# Patient Record
Sex: Male | Born: 1940 | Race: White | Hispanic: No | Marital: Married
Health system: Southern US, Community
[De-identification: ages and names within clinical notes are randomized; demographics above are authoritative.]

## PROBLEM LIST (undated history)

## (undated) DIAGNOSIS — R1312 Dysphagia, oropharyngeal phase: Secondary | ICD-10-CM

## (undated) DIAGNOSIS — G218 Other secondary parkinsonism: Secondary | ICD-10-CM

## (undated) DIAGNOSIS — G4700 Insomnia, unspecified: Secondary | ICD-10-CM

## (undated) DIAGNOSIS — F0390 Unspecified dementia without behavioral disturbance: Secondary | ICD-10-CM

## (undated) DIAGNOSIS — K219 Gastro-esophageal reflux disease without esophagitis: Secondary | ICD-10-CM

## (undated) DIAGNOSIS — E785 Hyperlipidemia, unspecified: Secondary | ICD-10-CM

## (undated) DIAGNOSIS — J449 Chronic obstructive pulmonary disease, unspecified: Secondary | ICD-10-CM

## (undated) DIAGNOSIS — I2510 Atherosclerotic heart disease of native coronary artery without angina pectoris: Secondary | ICD-10-CM

## (undated) DIAGNOSIS — I4891 Unspecified atrial fibrillation: Secondary | ICD-10-CM

## (undated) DIAGNOSIS — G20 Parkinson's disease: Secondary | ICD-10-CM

---

## 2013-12-05 ENCOUNTER — Ambulatory Visit: Payer: Self-pay | Admitting: Physician Assistant

## 2013-12-07 ENCOUNTER — Ambulatory Visit: Payer: Self-pay | Admitting: Physician Assistant

## 2017-01-14 ENCOUNTER — Emergency Department
Admission: EM | Admit: 2017-01-14 | Discharge: 2017-01-14 | Disposition: A | Discharge status: Home / Self Care | Payer: Medicare Other | Attending: Emergency Medicine | Admitting: Emergency Medicine

## 2017-01-14 ENCOUNTER — Emergency Department: Payer: Medicare Other

## 2017-01-14 ENCOUNTER — Encounter: Payer: Self-pay | Admitting: Emergency Medicine

## 2017-01-14 DIAGNOSIS — I2510 Atherosclerotic heart disease of native coronary artery without angina pectoris: Secondary | ICD-10-CM | POA: Insufficient documentation

## 2017-01-14 DIAGNOSIS — G20 Parkinson's disease: Secondary | ICD-10-CM | POA: Insufficient documentation

## 2017-01-14 DIAGNOSIS — R410 Disorientation, unspecified: Secondary | ICD-10-CM

## 2017-01-14 DIAGNOSIS — F0281 Dementia in other diseases classified elsewhere with behavioral disturbance: Secondary | ICD-10-CM | POA: Insufficient documentation

## 2017-01-14 DIAGNOSIS — J449 Chronic obstructive pulmonary disease, unspecified: Secondary | ICD-10-CM | POA: Diagnosis not present

## 2017-01-14 DIAGNOSIS — R4182 Altered mental status, unspecified: Secondary | ICD-10-CM | POA: Diagnosis present

## 2017-01-14 DIAGNOSIS — F02818 Dementia in other diseases classified elsewhere, unspecified severity, with other behavioral disturbance: Secondary | ICD-10-CM

## 2017-01-14 HISTORY — DX: Unspecified dementia, unspecified severity, without behavioral disturbance, psychotic disturbance, mood disturbance, and anxiety: F03.90

## 2017-01-14 HISTORY — DX: Hyperlipidemia, unspecified: E78.5

## 2017-01-14 HISTORY — DX: Insomnia, unspecified: G47.00

## 2017-01-14 HISTORY — DX: Gastro-esophageal reflux disease without esophagitis: K21.9

## 2017-01-14 HISTORY — DX: Parkinson's disease: G20

## 2017-01-14 HISTORY — DX: Atherosclerotic heart disease of native coronary artery without angina pectoris: I25.10

## 2017-01-14 HISTORY — DX: Other secondary parkinsonism: G21.8

## 2017-01-14 HISTORY — DX: Dysphagia, oropharyngeal phase: R13.12

## 2017-01-14 HISTORY — DX: Chronic obstructive pulmonary disease, unspecified: J44.9

## 2017-01-14 HISTORY — DX: Unspecified atrial fibrillation: I48.91

## 2017-01-14 MED ORDER — SODIUM CHLORIDE 0.9 % IV BOLUS (SEPSIS): 1000.0000 mL | Freq: Once | INTRAVENOUS | Status: DC

## 2017-01-14 MED ORDER — DEXTROSE 5 % IV SOLN: 1.0000 g | Freq: Once | INTRAVENOUS | Status: DC

## 2017-01-14 NOTE — ED Provider Notes (Signed)
[facility] Emergency Department Provider Note  ____________________________________________   First MD Initiated Contact with Patient 01/14/17 1702     (approximate)  I have reviewed the triage vital signs and the nursing notes.   HISTORY  Chief Complaint Altered Mental Status  Level V exemption history Limited by the patient's dementia  HPI Ethan Ellison is a 76 y.o. male who comes to the emergency department via EMS after reportedly having a behavioral outburst at his nursing home. He is a long-standing history of dementia and apparently today she began fighting and punching at the staff. He was given an unknown amount of lorazepam prior to transportation to the hospital. On chart review the patient has a history of acute urinary retention thought to be secondary to anticholinergic medication use. He is currently fully dependent.   Past Medical History:  Diagnosis Date  . COPD (chronic obstructive pulmonary disease) (HCC)   . Coronary atherosclerosis of native coronary artery   . Dementia   . Dysphagia, oropharyngeal phase   . GERD (gastroesophageal reflux disease)   . Hyperlipidemia   . Insomnia   . Insomnia, unspecified   . Other secondary parkinsonism (HCC)   . Parkinsons disease (HCC)   . Unspecified atrial fibrillation (HCC)     There are no active problems to display for this patient.   No past surgical history on file.  Prior to Admission medications   Not on File    Allergies Bactrim [sulfamethoxazole-trimethoprim]  No family history on file.  Social History Social History  Substance Use Topics  . Smoking status: Unknown If Ever Smoked  . Smokeless tobacco: Not on file  . Alcohol use No    Review of Systems Level V exemption history Limited by the patient's dementia ____________________________________________   PHYSICAL EXAM:  VITAL SIGNS: ED Triage Vitals  Enc Vitals Group     BP      Pulse      Resp        Temp      Temp src      SpO2      Weight      Height      Head Circumference      Peak Flow      Pain Score      Pain Loc      Pain Edu?      Excl. in GC?     Constitutional: severe dementia alert and oriented to his name only closing his eyes and not very responsive. No acute distress Eyes: PERRL EOMI.pupils midrange and brisk Head: Atraumatic. Nose: No congestion/rhinnorhea. Mouth/Throat: No trismus Neck: No stridor.   Cardiovascular: Normal rate, regular rhythm. Grossly normal heart sounds.  Good peripheral circulation. Respiratory: Normal respiratory effort.  No retractions. Lungs CTAB and moving good air Gastrointestinal: soft nontender Foley catheter in place with purulent dark urine Musculoskeletal: No lower extremity edema   Neurologic:   No gross focal neurologic deficits are appreciated. Skin:  Skin is warm, dry and intact. No rash noted. Psychiatric: profound dementia   ____________________________________________   DIFFERENTIAL includes but not limited to  Urinary tract infection, metabolic derangement, sundowning, behavioral disturbance ____________________________________________   LABS (all labs ordered are listed, but only abnormal results are displayed)  Labs Reviewed  URINE CULTURE  URINALYSIS, COMPLETE (UACMP) WITH MICROSCOPIC  COMPREHENSIVE METABOLIC PANEL  CBC WITH DIFFERENTIAL/PLATELET  TROPONIN I     __________________________________________  EKG  ED ECG REPORT I, Merrily BrittleNeil Floyde Dingley, the attending physician, personally  viewed and interpreted this ECG.  Date: 01/14/2017 Rate: 76 Rhythm: normal sinus rhythm QRS Axis: normal Intervals: normal ST/T Wave abnormalities: normal Narrative Interpretation: extremely wavy baseline and difficult to interpret but no acute disease noted  ____________________________________________  RADIOLOGY   ____________________________________________   PROCEDURES  Procedure(s) performed:  no  Procedures  Critical Care performed: no  Observation: no ____________________________________________   INITIAL IMPRESSION / ASSESSMENT AND PLAN / ED COURSE  Pertinent labs & imaging results that were available during my care of the patient were reviewed by me and considered in my medical decision making (see chart for details).  On arrival the patient is calm and cooperative. He has no localizing signs or symptoms. His Foley catheter has urine that appears purulent and clearly infected. On chart review he has no urine cultures in our system. We will take out his Foley and replace it as well as cover him with ceftriaxone while we await the results.     ----------------------------------------- 5:28 PM on 01/14/2017 -----------------------------------------  The patient's wife and daughter arrived to the emergency department and would like to take him to [facility] for further evaluation. They said that all of his care is at that hospital and while there appreciated of what we have done and they would prefer he have continuity of care which I think is reasonable. The patient is clearly not septic and while the patient cannot make medical decisions his power of attorney is present and is providing a rational explanation for discharge. We have already changed his Foley catheter today. He is discharged ___________________________________ in the custody of his wife and daughter.________   FINAL CLINICAL IMPRESSION(S) / ED DIAGNOSES  Final diagnoses:  Dementia due to Parkinson's disease with behavioral disturbance (HCC)  Delirium      NEW MEDICATIONS STARTED DURING THIS VISIT:  New Prescriptions   No medications on file     Note:  This document was prepared using Dragon voice recognition software and may include unintentional dictation errors.     Merrily Brittle, MD 01/14/17 1729

## 2017-01-14 NOTE — ED Notes (Addendum)
Pt arrived with foley catheter in place. Catheter tubing has large clots of sediment. Clumps and urine in tubing. Order for removal. 30cc fluid removed from balloon prior to removing. Removed with no problems. New one inserted. Pt tolerated well.

## 2017-01-14 NOTE — ED Notes (Signed)
Patient discharged with indwelling catheter in place. Family verbalized understanding of catheter care.

## 2017-01-14 NOTE — ED Triage Notes (Signed)
Patient from [facility] via [facility]. Staff report patient has had increasing and agitation with staff. Per EMS, staff gave patient unknown amount of PO ativan earlier today. Patient has history of dementia and is oriented at baseline. Patient arrived with urinary catheter in place.

## 2017-01-14 NOTE — ED Notes (Signed)
Patient's family at bedside requesting to speak to MD. Family is refusing for patient to have chest xray or further testing at this time. Per family they would like to have patient discharged and take him by POV to another facility. Dr. Lamont Snowball made aware and at bedside now.

## 2017-01-14 NOTE — Discharge Instructions (Signed)
If you change your minds and would like Ethan Ellison evaluated further at our hospital please do not hesitate to come back at any point.

## 2017-01-14 NOTE — ED Notes (Signed)
MD agreeable to patient being discharged to care of family. This RN explained to family that IV would have to be removed prior to discharge. Also explained to family that catheter had been changed upon patient arrival. Family states they will be driving patient to [facility] for continuation of care. Verbalized understanding of discharge instructions that they were assuming care of patient.

## 2017-01-24 ENCOUNTER — Emergency Department
Admission: EM | Admit: 2017-01-24 | Discharge: 2017-01-25 | Disposition: A | Discharge status: Home / Self Care | Payer: Medicare Other | Attending: Emergency Medicine | Admitting: Emergency Medicine

## 2017-01-24 ENCOUNTER — Emergency Department: Payer: Medicare Other

## 2017-01-24 DIAGNOSIS — S0080XA Unspecified superficial injury of other part of head, initial encounter: Secondary | ICD-10-CM | POA: Diagnosis present

## 2017-01-24 DIAGNOSIS — S0083XA Contusion of other part of head, initial encounter: Secondary | ICD-10-CM

## 2017-01-24 DIAGNOSIS — T148XXA Other injury of unspecified body region, initial encounter: Secondary | ICD-10-CM | POA: Diagnosis not present

## 2017-01-24 DIAGNOSIS — R310 Gross hematuria: Secondary | ICD-10-CM

## 2017-01-24 DIAGNOSIS — W06XXXA Fall from bed, initial encounter: Secondary | ICD-10-CM | POA: Insufficient documentation

## 2017-01-24 DIAGNOSIS — W19XXXA Unspecified fall, initial encounter: Secondary | ICD-10-CM

## 2017-01-24 DIAGNOSIS — Y939 Activity, unspecified: Secondary | ICD-10-CM | POA: Diagnosis not present

## 2017-01-24 DIAGNOSIS — J449 Chronic obstructive pulmonary disease, unspecified: Secondary | ICD-10-CM | POA: Insufficient documentation

## 2017-01-24 DIAGNOSIS — Z79899 Other long term (current) drug therapy: Secondary | ICD-10-CM | POA: Insufficient documentation

## 2017-01-24 DIAGNOSIS — S72411A Displaced unspecified condyle fracture of lower end of right femur, initial encounter for closed fracture: Secondary | ICD-10-CM | POA: Insufficient documentation

## 2017-01-24 DIAGNOSIS — Y92193 Bedroom in other specified residential institution as the place of occurrence of the external cause: Secondary | ICD-10-CM | POA: Diagnosis not present

## 2017-01-24 DIAGNOSIS — Y999 Unspecified external cause status: Secondary | ICD-10-CM | POA: Insufficient documentation

## 2017-01-24 DIAGNOSIS — Z7982 Long term (current) use of aspirin: Secondary | ICD-10-CM | POA: Insufficient documentation

## 2017-01-24 DIAGNOSIS — F0390 Unspecified dementia without behavioral disturbance: Secondary | ICD-10-CM | POA: Insufficient documentation

## 2017-01-24 DIAGNOSIS — S72491A Other fracture of lower end of right femur, initial encounter for closed fracture: Secondary | ICD-10-CM

## 2017-01-24 LAB — URINALYSIS, COMPLETE (UACMP) WITH MICROSCOPIC
Bacteria, UA: NONE SEEN
Bilirubin Urine: NEGATIVE
GLUCOSE, UA: NEGATIVE mg/dL
KETONES UR: 20 mg/dL — AB
Nitrite: NEGATIVE
PROTEIN: 30 mg/dL — AB
SQUAMOUS EPITHELIAL / LPF: NONE SEEN
Specific Gravity, Urine: 1.016 (ref 1.005–1.030)
pH: 7 (ref 5.0–8.0)

## 2017-01-24 LAB — BASIC METABOLIC PANEL
Anion gap: 7 (ref 5–15)
BUN: 21 mg/dL — AB (ref 6–20)
CHLORIDE: 102 mmol/L (ref 101–111)
CO2: 29 mmol/L (ref 22–32)
Calcium: 8.6 mg/dL — ABNORMAL LOW (ref 8.9–10.3)
Creatinine, Ser: 1.01 mg/dL (ref 0.61–1.24)
GFR calc Af Amer: >60 mL/min (ref >60)
GFR calc non Af Amer: >60 mL/min (ref >60)
GLUCOSE: 91 mg/dL (ref 65–99)
POTASSIUM: 3.8 mmol/L (ref 3.5–5.1)
Sodium: 138 mmol/L (ref 135–145)

## 2017-01-24 LAB — CBC WITH DIFFERENTIAL/PLATELET
Basophils Absolute: 0.1 10*3/uL (ref 0–0.1)
Basophils Relative: 1 %
Eosinophils Absolute: 0.2 10*3/uL (ref 0–0.7)
Eosinophils Relative: 2 %
HCT: 34.2 % — ABNORMAL LOW (ref 40.0–52.0)
HEMOGLOBIN: 11.7 g/dL — AB (ref 13.0–18.0)
LYMPHS PCT: 13 %
Lymphs Abs: 1.4 10*3/uL (ref 1.0–3.6)
MCH: 30.2 pg (ref 26.0–34.0)
MCHC: 34.2 g/dL (ref 32.0–36.0)
MCV: 88.3 fL (ref 80.0–100.0)
Monocytes Absolute: 0.9 10*3/uL (ref 0.2–1.0)
Monocytes Relative: 8 %
NEUTROS PCT: 76 %
Neutro Abs: 8.5 10*3/uL — ABNORMAL HIGH (ref 1.4–6.5)
Platelets: 288 10*3/uL (ref 150–440)
RBC: 3.87 MIL/uL — AB (ref 4.40–5.90)
RDW: 13.2 % (ref 11.5–14.5)
WBC: 11.1 10*3/uL — AB (ref 3.8–10.6)

## 2017-01-24 NOTE — ED Notes (Signed)
Anchor applied to posterior of left leg to keep foley out primary grasp,

## 2017-01-24 NOTE — Discharge Instructions (Signed)
Use the knee immobilizer as needed for pain and discomfort. Ambulate and weight-bear as tolerated. Follow-up with orthopedics. Return to the emergency room for fever, chills, abdominal pain, nausea, vomiting, or if the Foley catheter becomes obstructed.

## 2017-01-24 NOTE — ED Notes (Addendum)
Patient transported to CT and XR att 

## 2017-01-24 NOTE — ED Provider Notes (Addendum)
[facility] Emergency Department Provider Note  ____________________________________________  Time seen: Approximately 10:58 PM  I have reviewed the triage vital signs and the nursing notes.   HISTORY  Chief Complaint Fall  Level 5 caveat:  Portions of the history and physical were unable to be obtained due to dementia   HPI Ethan Ellison is a 76 y.o. male with a history of dementia, Parkinson's, CAD, and COPD who presents for evaluation after a fall. Patient fell off his bed 3 times this evening at the skilled nursing facility. According to EMS the bed it is not very high from the floor. Patient has abrasions to his forehead and his right knee. Patient is also been pulling on his Foley catheter and started having hematuria upon arrival to the emergency room. Patient is very confused and according to the family this has been his baseline for the last 5 weeks. According to patient's son 5 weeks ago he started to have falls and noted to have labile BP, his Neurologist took him off of his Parkinson's meds and patient's neuro status started to decline. Then he was diagnosed with a urinary tract infection and urinary retention. Since then patient has become extremely confused with several falls.  Past Medical History:  Diagnosis Date  . COPD (chronic obstructive pulmonary disease) (HCC)   . Coronary atherosclerosis of native coronary artery   . Dementia   . Dysphagia, oropharyngeal phase   . GERD (gastroesophageal reflux disease)   . Hyperlipidemia   . Insomnia   . Insomnia, unspecified   . Other secondary parkinsonism (HCC)   . Parkinsons disease (HCC)   . Unspecified atrial fibrillation (HCC)     There are no active problems to display for this patient.   No past surgical history on file.  Prior to Admission medications   Medication Sig Start Date End Date Taking? Authorizing Provider  albuterol (PROVENTIL) (2.5 MG/3ML) 0.083% nebulizer solution  Take 2.5 mg by nebulization every 6 (six) hours as needed for wheezing or shortness of breath.   Yes [provider]  aspirin EC 81 MG tablet Take 81 mg by mouth daily.   Yes [provider]  carbidopa-levodopa (SINEMET IR) 25-100 MG tablet Take 1.5 tablets by mouth 3 (three) times daily.   Yes [provider]  finasteride (PROSCAR) 5 MG tablet Take 1 tablet by mouth daily.   Yes [provider]  Fluticasone-Salmeterol (ADVAIR) 250-50 MCG/DOSE AEPB Inhale 1 puff into the lungs 2 (two) times daily.   Yes [provider]  MULTAQ 400 MG tablet Take 400 mg by mouth 2 (two) times daily.   Yes [provider]  omeprazole (PRILOSEC) 20 MG capsule Take 20 mg by mouth daily.   Yes [provider]  QUEtiapine (SEROQUEL) 25 MG tablet Take 50 mg by mouth at bedtime.    Yes [provider]  tamsulosin (FLOMAX) 0.4 MG CAPS capsule Take 0.4 mg by mouth daily.   Yes [provider]  traZODone (DESYREL) 25 mg TABS tablet Take 25 mg by mouth every morning.   Yes [provider]  traZODone (DESYREL) 50 MG tablet Take 50 mg by mouth at bedtime as needed for sleep.    Yes [provider]  VESICARE 10 MG tablet Take 10 mg by mouth daily.   Yes [provider]    Allergies Bactrim [sulfamethoxazole-trimethoprim]  No family history on file.  Social History Social History  Substance Use Topics  . Smoking status:  Unknown If Ever Smoked  . Smokeless tobacco: Not on file  . Alcohol use No    Review of Systems Skin: + several abrasions Neurological: + head injury. Level 5 caveat:  Portions of the history and physical were unable to be obtained due to dementia   ____________________________________________   PHYSICAL EXAM:  VITAL SIGNS: ED Triage Vitals [01/24/17 2125]  Enc Vitals Group     BP (!) 152/81     Pulse Rate 74     Resp 18     Temp 98.8 F (37.1 C)     Temp Source Oral     SpO2 100  %     Weight 155 lb (70.3 kg)     Height 6\' 1"  (1.854 m)     Head Circumference      Peak Flow      Pain Score      Pain Loc      Pain Edu?      Excl. in GC?    Constitutional: Awake, speaking incoherent, smiling, no distress. Does not appear intoxicated. HEENT Head: Normocephalic, abrasion to forehead. Face: No facial bony tenderness. Stable midface Ears: No hemotympanum bilaterally. No Battle sign Eyes: No eye injury. PERRL. No raccoon eyes Nose: Nontender. No epistaxis. No rhinorrhea Mouth/Throat: Mucous membranes are moist. No oropharyngeal blood. No dental injury. Airway patent without stridor. Normal voice. Neck: no C-collar in place. No midline c-spine tenderness.  Cardiovascular: Normal rate, regular rhythm. Normal and symmetric distal pulses are present in all extremities. Pulmonary/Chest: Chest wall is stable and nontender to palpation/compression. Normal respiratory effort. Breath sounds are normal. No crepitus.  Abdominal: Soft, nontender, non distended. Foley in place draining bloody urine Musculoskeletal: Nontender with normal full range of motion in all extremities. No deformities. No thoracic or lumbar midline spinal tenderness. Pelvis is stable. Skin: Skin is warm, dry and intact. No abrasions or contutions. Neurological: Moves all extremities to command. Face is symmeric  Glascow Coma Score: 4 - Opens eyes on own 5 - Pushes away noxious stimulus 3 - Talks, but nonsensical GCS: 12   ____________________________________________   LABS (all labs ordered are listed, but only abnormal results are displayed)  Labs Reviewed  CBC WITH DIFFERENTIAL/PLATELET - Abnormal; Notable for the following:       Result Value   WBC 11.1 (*)    RBC 3.87 (*)    Hemoglobin 11.7 (*)    HCT 34.2 (*)    Neutro Abs 8.5 (*)    All other components within normal limits  BASIC METABOLIC PANEL - Abnormal; Notable for the following:    BUN 21 (*)    Calcium 8.6 (*)    All other  components within normal limits  URINALYSIS, COMPLETE (UACMP) WITH MICROSCOPIC - Abnormal; Notable for the following:    Color, Urine YELLOW (*)    APPearance CLOUDY (*)    Hgb urine dipstick LARGE (*)    Ketones, ur 20 (*)    Protein, ur 30 (*)    Leukocytes, UA SMALL (*)    All other components within normal limits  URINE CULTURE   ____________________________________________  EKG  none ____________________________________________  RADIOLOGY  CT HEAD: 1. No acute intracranial process. 2. Moderate to severe parenchymal brain volume loss. 3. Mild chronic small vessel ischemic disease.  CT CERVICAL SPINE: 1. No acute fracture or malalignment. 2. Old moderate T1 compression fracture.  XR R knee:  Suggestion of focal avulsion off of the medial aspect of the medial femoral condyles.  ____________________________________________   PROCEDURES  Procedure(s) performed: None Procedures Critical Care performed:  None ____________________________________________   INITIAL IMPRESSION / ASSESSMENT AND PLAN / ED COURSE   76 y.o. male with a history of dementia, Parkinson's, CAD, and COPD who presents for evaluation after a fall. Patient also with hematuria seen on Foley catheter as patient has tried a pulled the Foley catheter out. Foley still in place and draining urine. We'll do a head CT, CT cervical spine, x-ray of his knee, check basic blood work and urinalysis.  Clinical Course as of Sep 09 0000  Sat Jan 24, 2017  2322 UA showing large hemoglobin with blood and wbc's but no nitrite and bacteria therefore we'll not treat until culture is back. Blood work is pending. CT head and neck were no acute injuries. X-ray of the knee with questionable avulsion of femoral condyle. Patient moving knee with no discomfort, no pain on palpation. Family has requested that we do not place him on a knee immobilizer. I will refer to Orthopedics. Labs WNL. Foley now draining clear urine. Will dc  back to SNF.  [CV]    Clinical Course User Index [CV] Nita Sickle, MD    Pertinent labs & imaging results that were available during my care of the patient were reviewed by me and considered in my medical decision making (see chart for details).    ____________________________________________   FINAL CLINICAL IMPRESSION(S) / ED DIAGNOSES  Final diagnoses:  Fall, initial encounter  Abrasion  Contusion of other part of head, initial encounter  Avulsion fracture of femoral condyle, right, closed, initial encounter (HCC)  Gross hematuria      NEW MEDICATIONS STARTED DURING THIS VISIT:  New Prescriptions   No medications on file     Note:  This document was prepared using Dragon voice recognition software and may include unintentional dictation errors.    Don Perking, [place], MD 01/24/17 2329    Don Perking, [place], MD 01/25/17 0000

## 2017-01-24 NOTE — ED Triage Notes (Signed)
Patient presents to Emergency Department via [facility] from [facility] with complaints of, per EMS, fall out of low lying (approx 6 in) x 3 today.    Pt with left arm skin tear, right knee abrasion and right temple contusion. Pt appears with some dementia, pt intermittant responses, A&O x2, some confused speech.  Foley in place with some blood and meatus, per family pt has pulled foley out x 5.

## 2017-01-25 NOTE — ED Notes (Signed)
family requests EMS transport back to facility; declines knee immobilizer

## 2017-01-26 LAB — URINE CULTURE: CULTURE: NO GROWTH

## 2017-02-24 ENCOUNTER — Emergency Department
Admission: EM | Admit: 2017-02-24 | Discharge: 2017-02-24 | Disposition: A | Discharge status: Home / Self Care | Payer: Medicare Other | Attending: Student in an Organized Health Care Education/Training Program | Admitting: Student in an Organized Health Care Education/Training Program

## 2017-02-24 ENCOUNTER — Encounter: Payer: Self-pay | Admitting: Emergency Medicine

## 2017-02-24 ENCOUNTER — Emergency Department: Payer: Medicare Other

## 2017-02-24 DIAGNOSIS — Z7982 Long term (current) use of aspirin: Secondary | ICD-10-CM | POA: Diagnosis not present

## 2017-02-24 DIAGNOSIS — Y999 Unspecified external cause status: Secondary | ICD-10-CM | POA: Insufficient documentation

## 2017-02-24 DIAGNOSIS — W19XXXA Unspecified fall, initial encounter: Secondary | ICD-10-CM

## 2017-02-24 DIAGNOSIS — Y92129 Unspecified place in nursing home as the place of occurrence of the external cause: Secondary | ICD-10-CM | POA: Diagnosis not present

## 2017-02-24 DIAGNOSIS — G20 Parkinson's disease: Secondary | ICD-10-CM | POA: Diagnosis not present

## 2017-02-24 DIAGNOSIS — Y939 Activity, unspecified: Secondary | ICD-10-CM | POA: Insufficient documentation

## 2017-02-24 DIAGNOSIS — W050XXA Fall from non-moving wheelchair, initial encounter: Secondary | ICD-10-CM | POA: Diagnosis not present

## 2017-02-24 DIAGNOSIS — S0181XA Laceration without foreign body of other part of head, initial encounter: Secondary | ICD-10-CM | POA: Diagnosis not present

## 2017-02-24 DIAGNOSIS — S0990XA Unspecified injury of head, initial encounter: Secondary | ICD-10-CM

## 2017-02-24 DIAGNOSIS — J449 Chronic obstructive pulmonary disease, unspecified: Secondary | ICD-10-CM | POA: Insufficient documentation

## 2017-02-24 DIAGNOSIS — Z23 Encounter for immunization: Secondary | ICD-10-CM | POA: Diagnosis not present

## 2017-02-24 DIAGNOSIS — Z79899 Other long term (current) drug therapy: Secondary | ICD-10-CM | POA: Diagnosis not present

## 2017-02-24 DIAGNOSIS — I2510 Atherosclerotic heart disease of native coronary artery without angina pectoris: Secondary | ICD-10-CM | POA: Diagnosis not present

## 2017-02-24 MED ORDER — BACITRACIN ZINC 500 UNIT/GM EX OINT
TOPICAL_OINTMENT | Freq: Once | CUTANEOUS | Status: AC
  Administered 2017-02-24: 14:00:00 via TOPICAL
  Filled 2017-02-24: qty 0.9

## 2017-02-24 MED ORDER — LIDOCAINE-EPINEPHRINE-TETRACAINE (LET) SOLUTION
3.0000 mL | Freq: Once | NASAL | Status: AC
  Administered 2017-02-24: 3 mL via TOPICAL
  Filled 2017-02-24: qty 3

## 2017-02-24 MED ORDER — TETANUS-DIPHTH-ACELL PERTUSSIS 5-2.5-18.5 LF-MCG/0.5 IM SUSP
0.5000 mL | Freq: Once | INTRAMUSCULAR | Status: AC
  Administered 2017-02-24: 0.5 mL via INTRAMUSCULAR
  Filled 2017-02-24: qty 0.5

## 2017-02-24 MED ORDER — LIDOCAINE-EPINEPHRINE-TETRACAINE (LET) SOLUTION
3.0000 mL | Freq: Once | NASAL | Status: AC
  Administered 2017-02-24: 14:00:00 3 mL via TOPICAL
  Filled 2017-02-24: qty 3

## 2017-02-24 MED ORDER — LIDOCAINE HCL (PF) 1 % IJ SOLN
5.0000 mL | Freq: Once | INTRAMUSCULAR | Status: AC
  Administered 2017-02-24: 5 mL via INTRADERMAL
  Filled 2017-02-24: qty 5

## 2017-02-24 NOTE — ED Notes (Signed)
Signature pad not working. Hard copy printed and signed by family.

## 2017-02-24 NOTE — ED Notes (Signed)
Ems here to transport pt  Pt alert.

## 2017-02-24 NOTE — ED Provider Notes (Signed)
[facility] Emergency Department Provider Note    First MD Initiated Contact with Patient 02/24/17 1154     (approximate)  I have reviewed the triage vital signs and the nursing notes.   HISTORY  Chief Complaint Fall    HPI Ethan Ellison is a 76 y.o. male with history of dementia living at nursing facility with recent visits to the ER for frequent falls presents after mechanical fall from low height with head injury and laceration to his left forehead. There is no loss of consciousness. Denies any other pain. States the pain at that left forehead is mild to moderate and achy in nature. Denies any new numbness or tingling. Does take aspirin but is not on any other anticoagulation. Denies any abdominal pain chest pain or shortness of breath.   Past Medical History:  Diagnosis Date  . COPD (chronic obstructive pulmonary disease) (HCC)   . Coronary atherosclerosis of native coronary artery   . Dementia   . Dysphagia, oropharyngeal phase   . GERD (gastroesophageal reflux disease)   . Hyperlipidemia   . Insomnia   . Insomnia, unspecified   . Other secondary parkinsonism (HCC)   . Parkinsons disease (HCC)   . Unspecified atrial fibrillation (HCC)    No family history on file. No past surgical history on file. There are no active problems to display for this patient.     Prior to Admission medications   Medication Sig Start Date End Date Taking? Authorizing Provider  albuterol (PROVENTIL) (2.5 MG/3ML) 0.083% nebulizer solution Take 2.5 mg by nebulization every 6 (six) hours as needed for wheezing or shortness of breath.    [provider]  aspirin EC 81 MG tablet Take 81 mg by mouth daily.    [provider]  carbidopa-levodopa (SINEMET IR) 25-100 MG tablet Take 1.5 tablets by mouth 3 (three) times daily.    [provider]  finasteride (PROSCAR) 5 MG tablet Take 1 tablet by mouth daily.    [provider]    Fluticasone-Salmeterol (ADVAIR) 250-50 MCG/DOSE AEPB Inhale 1 puff into the lungs 2 (two) times daily.    [provider]  MULTAQ 400 MG tablet Take 400 mg by mouth 2 (two) times daily.    [provider]  omeprazole (PRILOSEC) 20 MG capsule Take 20 mg by mouth daily.    [provider]  QUEtiapine (SEROQUEL) 25 MG tablet Take 50 mg by mouth at bedtime.     [provider]  tamsulosin (FLOMAX) 0.4 MG CAPS capsule Take 0.4 mg by mouth daily.    [provider]  traZODone (DESYREL) 25 mg TABS tablet Take 25 mg by mouth every morning.    [provider]  traZODone (DESYREL) 50 MG tablet Take 50 mg by mouth at bedtime as needed for sleep.     [provider]  VESICARE 10 MG tablet Take 10 mg by mouth daily.    [provider]    Allergies Bactrim [sulfamethoxazole-trimethoprim]    Social History Social History  Substance Use Topics  . Smoking status: Unknown If Ever Smoked  . Smokeless tobacco: Not on file  . Alcohol use No    Review of Systems Patient denies headaches, rhinorrhea, blurry vision, numbness, shortness of breath, chest pain, edema, cough, abdominal pain, nausea, vomiting, diarrhea, dysuria, fevers, rashes or hallucinations unless otherwise stated above in HPI. ____________________________________________   PHYSICAL EXAM:  VITAL SIGNS: There were no vitals filed for this visit.  Constitutional:  Alert  in no acute distress. Eyes: Conjunctivae are normal.  Head: 2cm laceration to left forehead, no glaeal involvement, hemostatic, no evidence of depressed fracture Nose: No congestion/rhinnorhea. Mouth/Throat: Mucous membranes are moist.   Neck: No stridor. Painless ROM.  Cardiovascular: Normal rate, regular rhythm. Grossly normal heart sounds.  Good peripheral circulation. Respiratory: Normal respiratory effort.  No retractions. Lungs CTAB. Gastrointestinal: Soft and nontender. No distention. No  abdominal bruits. No CVA tenderness. Genitourinary:  Musculoskeletal: No lower extremity tenderness nor edema.  No joint effusions. Neurologic:  Normal speech and language. No gross focal neurologic deficits are appreciated. No facial droop Skin:  Skin is warm, dry and intact. No rash noted. Psychiatric: Mood and affect are normal. Speech and behavior are normal.  ____________________________________________   LABS (all labs ordered are listed, but only abnormal results are displayed)  No results found for this or any previous visit (from the past 24 hour(s)). ____________________________________________ _____________________________  RADIOLOGY  I personally reviewed all radiographic images ordered to evaluate for the above acute complaints and reviewed radiology reports and findings.  These findings were personally discussed with the patient.  Please see medical record for radiology report.  ____________________________________________   PROCEDURES  Procedure(s) performed:  Marland KitchenMarland KitchenLaceration Repair Date/Time: 02/24/2017 1:51 PM Performed by: Willy Eddy Authorized by: Willy Eddy   Consent:    Consent obtained:  Verbal   Consent given by:  Patient   Risks discussed:  Infection, need for additional repair, nerve damage, pain, poor cosmetic result and tendon damage Anesthesia (see MAR for exact dosages):    Anesthesia method:  Topical application and local infiltration   Topical anesthetic:  LET   Local anesthetic:  Lidocaine 1% w/o epi Laceration details:    Location:  Face   Face location:  Forehead   Length (cm):  4   Depth (mm):  3 Repair type:    Repair type:  Intermediate Pre-procedure details:    Preparation:  Patient was prepped and draped in usual sterile fashion Treatment:    Area cleansed with:  Betadine   Amount of cleaning:  Standard Subcutaneous repair:    Suture size:  5-0   Suture material:  Vicryl   Suture technique:  Simple interrupted    Number of sutures:  4 Skin repair:    Repair method:  Sutures   Suture size:  6-0   Suture material:  Prolene   Suture technique:  Simple interrupted   Number of sutures:  8 Approximation:    Approximation:  Close   Vermilion border: well-aligned   Post-procedure details:    Dressing:  Antibiotic ointment   Patient tolerance of procedure:  Tolerated well, no immediate complications      Critical Care performed: no ____________________________________________   INITIAL IMPRESSION / ASSESSMENT AND PLAN / ED COURSE  Pertinent labs & imaging results that were available during my care of the patient were reviewed by me and considered in my medical decision making (see chart for details).  DDX: iph, laceration, contusion  Ethan Ellison is a 76 y.o. who presents to the ED with head injury as described above. CT head and neck ordered as the patient is high risk for traumatic injury shows no acute abnormality. Tetanus is updated. Wound care and laceration repaired as above. Patient without evidence of intracranial abnormality and is stable for discharge back to nursing facility.      ____________________________________________   FINAL CLINICAL IMPRESSION(S) / ED DIAGNOSES  Final diagnoses:  Fall, initial encounter  Injury  of head, initial encounter  Laceration of forehead, initial encounter      NEW MEDICATIONS STARTED DURING THIS VISIT:  New Prescriptions   No medications on file     Note:  This document was prepared using Dragon voice recognition software and may include unintentional dictation errors.    Willy Eddy, MD 02/24/17 1352

## 2017-02-24 NOTE — ED Triage Notes (Signed)
Pt to ED by EMS after a witnessed fall. Pt is wheelchair bound and tried to get out of his chair and fell and hit his head. Per staff no LOC. Pt has a laceration to the left side of his forehead.

## 2017-06-19 DEATH — deceased

## 2019-01-15 IMAGING — CT CT HEAD W/O CM
4 of 11 series · 11 of 47 positions shown, 12 images · non-contrast
Comparison: 01/24/2017 head and cervical spine CT.

CLINICAL DATA: Witnessed fall. Head injury. Left for head
laceration. Neck trauma.

EXAM:
CT HEAD WITHOUT CONTRAST
CT CERVICAL SPINE WITHOUT CONTRAST
TECHNIQUE: Multidetector CT imaging of the head and cervical spine was
performed following the standard protocol without intravenous
contrast. Multiplanar CT image reconstructions of the cervical spine
were also generated.

[Series 2: head wo · axial · 0.41mm/px · z∈[+230,+375]mm · 3 of 30 slices shown, 4 images]
[im 1/30  brain]
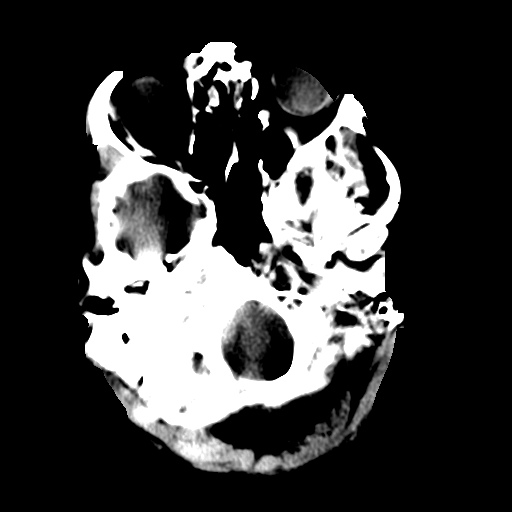
[im 1/30  bone]
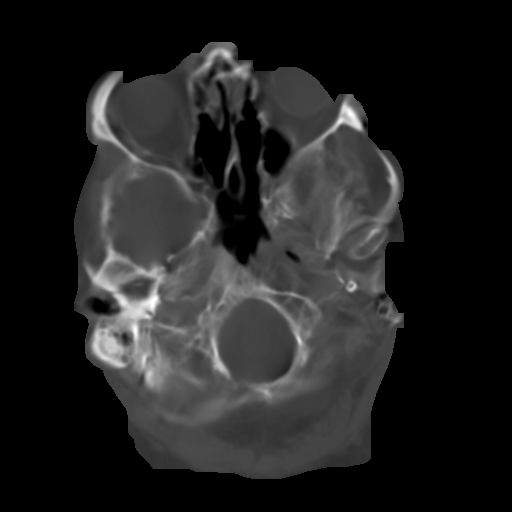
[im 15/30  brain]
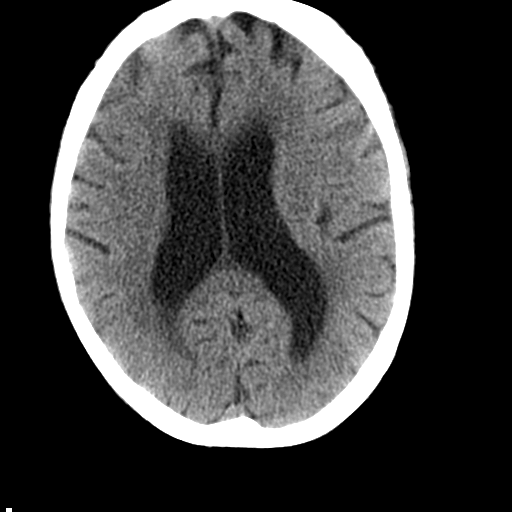
[im 30/30  brain]
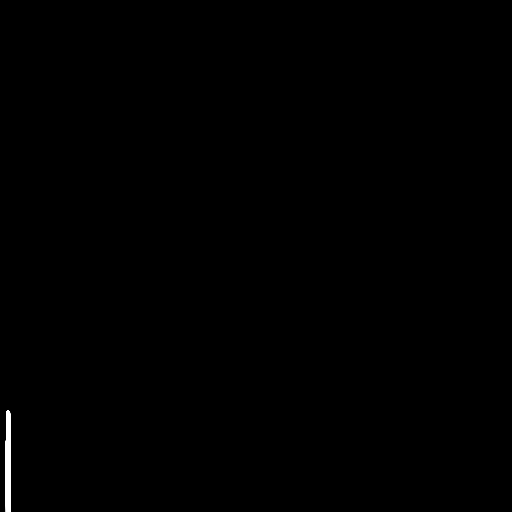

[Series 11: coronal soft tissue · coronal · 0.30mm/px · 2 of 84 slices shown]
[im 28/84  brain]
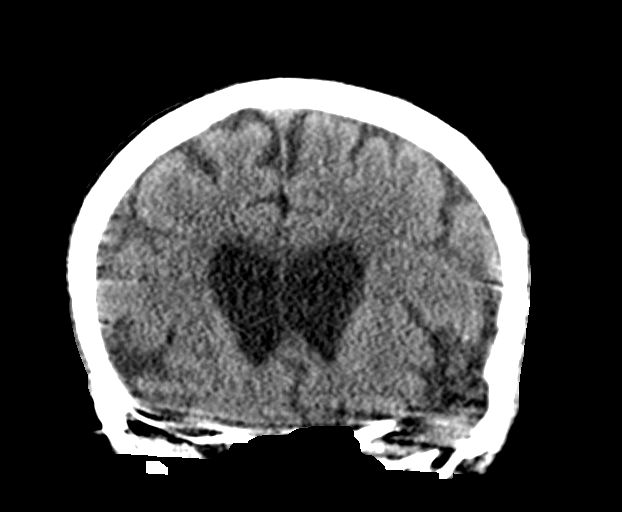
[im 56/84  brain]
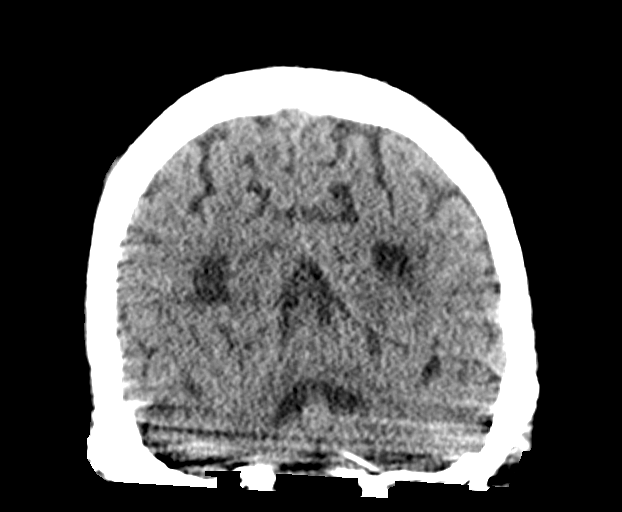

[Series 14: orthogonal bone · axial · 0.23mm/px · z∈[+72,+171]mm · 5 of 107 slices shown]
[im 11/107  bone]
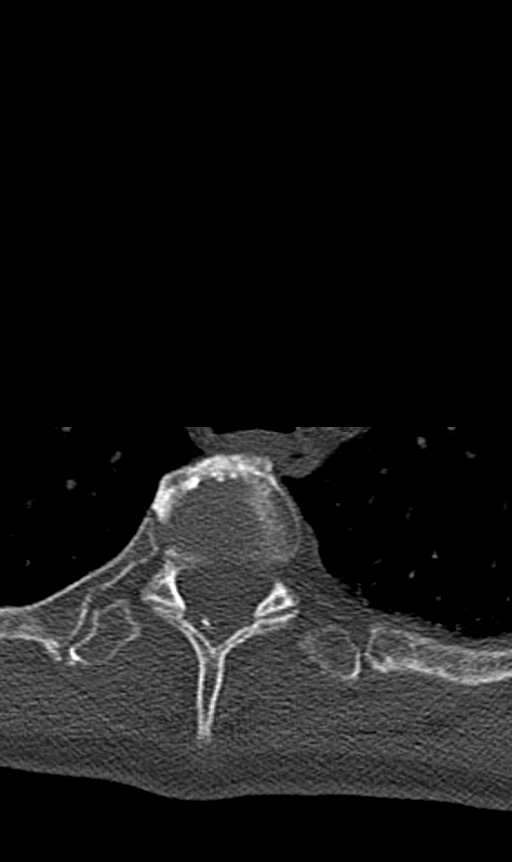
[im 22/107  bone]
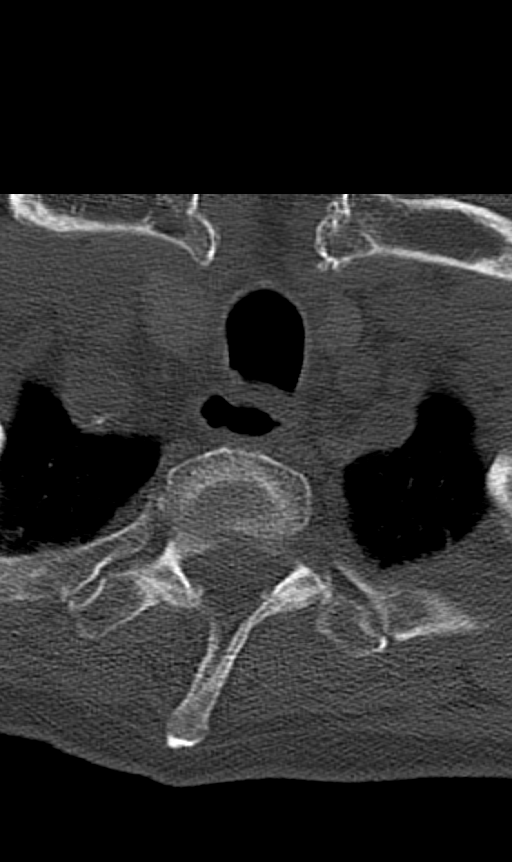
[im 32/107  bone]
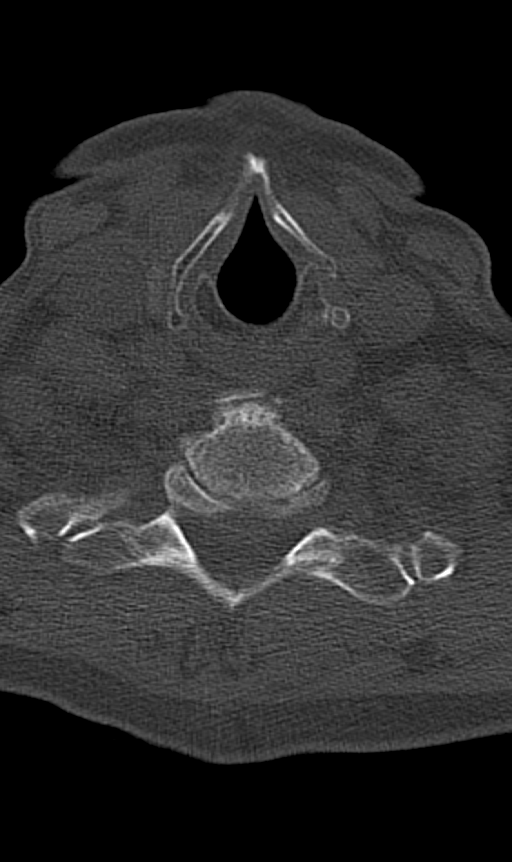
[im 43/107  bone]
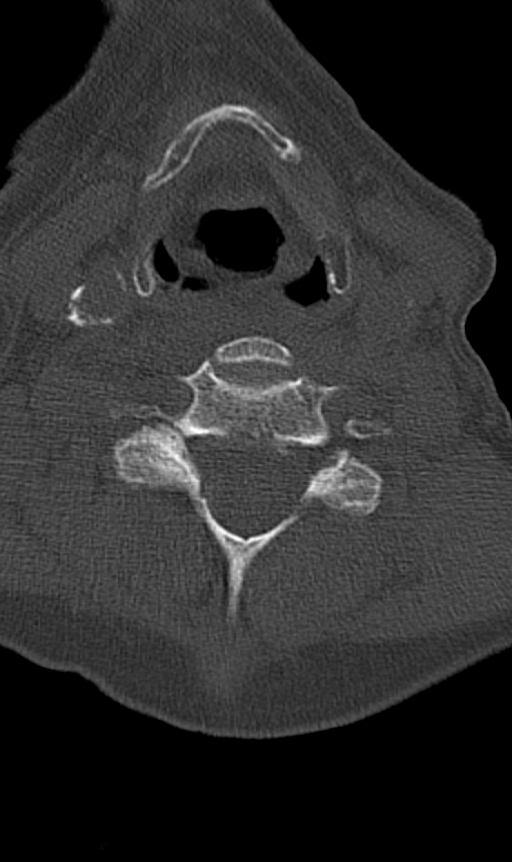
[im 64/107  bone]
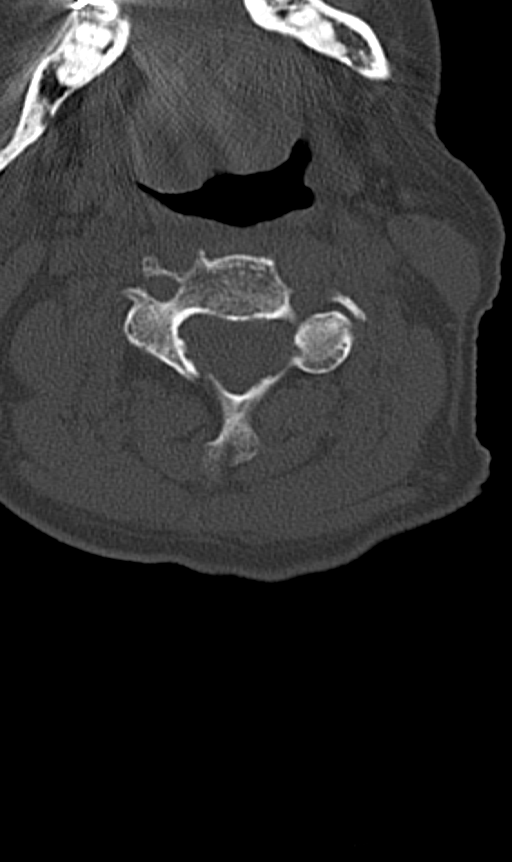

[Series 17: sagittal soft tissue · sagittal · 0.12mm/px · 1 of 84 slices shown]
[im 42/84  brain]
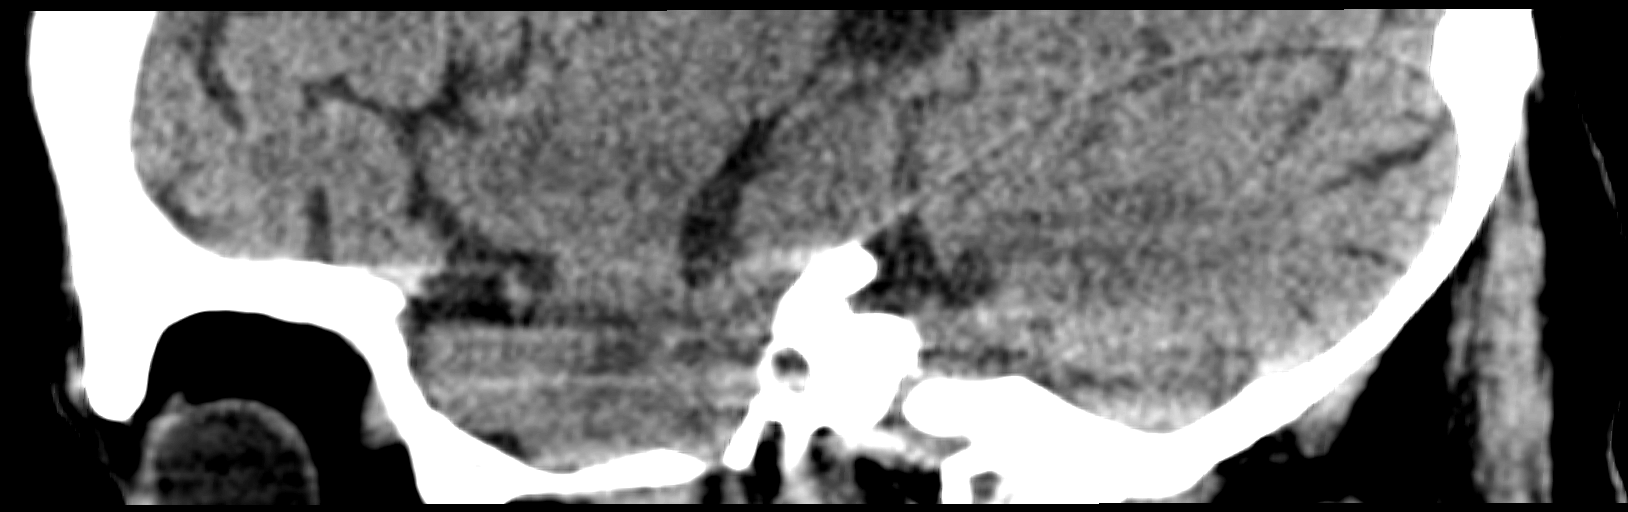

[11 of 47 positions shown; findings below may reference images not displayed]

FINDINGS: CT HEAD FINDINGS

Brain: No evidence of parenchymal hemorrhage or extra-axial fluid
collection. No mass lesion, mass effect, or midline shift. No CT
evidence of acute infarction. Nonspecific mild subcortical and
periventricular white matter hypodensity, most in keeping with
chronic small vessel ischemic change. Generalized cerebral volume
loss. Cerebral ventricle sizes are stable and concordant with the
degree of cerebral volume loss.

Vascular: No acute abnormality.

Skull: No evidence of calvarial fracture.

Sinuses/Orbits: No fluid levels. Mucoperiosteal thickening and
partial opacification in the bilateral ethmoidal air cells.

Other: Small partial right mastoid effusion. Clear left mastoid air
cells.

CT CERVICAL SPINE FINDINGS

Alignment: Reversal of the normal cervical lordosis. No acute
subluxation. Stable 2 mm anterolisthesis at C5-6 and 3 mm
anterolisthesis at C7-T1. Dens is well positioned between the
lateral masses of C1.

Skull base and vertebrae: Stable chronic moderate anterior T1
vertebral compression fracture with associated mild focal kyphotic
angulation. No acute cervical spine fracture. No aggressive
appearing focal osseous lesions.

Soft tissues and spinal canal: No prevertebral fluid or swelling. No
visible canal hematoma.

Disc levels: Marked multilevel degenerative disc disease throughout
the cervical spine, most prominent at C6-7 and C7-T1. Moderate to
severe bilateral facet arthropathy, asymmetric to the right.
Moderate degenerate foraminal stenosis on the right at C3-4. Mild
degenerative foraminal stenosis on the right at C4-5.

Upper chest: Calcified 3 mm apical right upper lobe granuloma.

Other: Small partial right mastoid effusion. Clear visualized left
mastoid air cells. No discrete thyroid nodules. No pathologically
enlarged cervical nodes.
IMPRESSION: 1. No evidence of acute intracranial abnormality. No evidence of
calvarial fracture.
2. Generalized cerebral volume loss with mild chronic small vessel
ischemic changes .
3. No cervical spine fracture or acute subluxation .
4. Stable chronic moderate T1 vertebral compression fracture with
associated focal kyphotic angulation.
5. Advanced degenerative changes in the cervical spine as detailed.
6. Nonspecific partial right mastoid effusion.
